# Patient Record
Sex: Female | Born: 1970 | Race: Black or African American | Hispanic: No | Marital: Single | Smoking: Never smoker
Health system: Southern US, Community
[De-identification: ages and names within clinical notes are randomized; demographics above are authoritative.]

## PROBLEM LIST (undated history)

## (undated) HISTORY — PX: TUBAL LIGATION: SHX77

---

## 1997-10-16 ENCOUNTER — Inpatient Hospital Stay (HOSPITAL_COMMUNITY): Admission: AD | Admit: 1997-10-16 | Discharge: 1997-10-16 | Payer: Self-pay | Admitting: Obstetrics

## 1997-10-20 ENCOUNTER — Ambulatory Visit (HOSPITAL_COMMUNITY): Admission: RE | Admit: 1997-10-20 | Discharge: 1997-10-20 | Payer: Self-pay | Admitting: *Deleted

## 1998-03-10 ENCOUNTER — Inpatient Hospital Stay (HOSPITAL_COMMUNITY): Admission: AD | Admit: 1998-03-10 | Discharge: 1998-03-12 | Payer: Self-pay | Admitting: Obstetrics & Gynecology

## 1998-11-07 ENCOUNTER — Inpatient Hospital Stay (HOSPITAL_COMMUNITY): Admission: AD | Admit: 1998-11-07 | Discharge: 1998-11-07 | Payer: Self-pay | Admitting: *Deleted

## 1999-02-03 ENCOUNTER — Emergency Department (HOSPITAL_COMMUNITY): Admission: EM | Admit: 1999-02-03 | Discharge: 1999-02-03 | Payer: Self-pay | Admitting: Emergency Medicine

## 1999-02-15 ENCOUNTER — Emergency Department (HOSPITAL_COMMUNITY): Admission: EM | Admit: 1999-02-15 | Discharge: 1999-02-15 | Payer: Self-pay | Admitting: Emergency Medicine

## 1999-04-12 ENCOUNTER — Emergency Department (HOSPITAL_COMMUNITY): Admission: EM | Admit: 1999-04-12 | Discharge: 1999-04-12 | Payer: Self-pay | Admitting: Emergency Medicine

## 1999-11-08 ENCOUNTER — Encounter: Payer: Self-pay | Admitting: Emergency Medicine

## 1999-11-08 ENCOUNTER — Emergency Department (HOSPITAL_COMMUNITY): Admission: EM | Admit: 1999-11-08 | Discharge: 1999-11-08 | Payer: Self-pay | Admitting: Emergency Medicine

## 2000-01-31 ENCOUNTER — Inpatient Hospital Stay (HOSPITAL_COMMUNITY): Admission: AD | Admit: 2000-01-31 | Discharge: 2000-01-31 | Payer: Self-pay | Admitting: *Deleted

## 2000-03-01 ENCOUNTER — Inpatient Hospital Stay (HOSPITAL_COMMUNITY): Admission: AD | Admit: 2000-03-01 | Discharge: 2000-03-01 | Payer: Self-pay | Admitting: *Deleted

## 2000-05-20 ENCOUNTER — Encounter: Payer: Self-pay | Admitting: *Deleted

## 2000-05-20 ENCOUNTER — Inpatient Hospital Stay (HOSPITAL_COMMUNITY): Admission: AD | Admit: 2000-05-20 | Discharge: 2000-05-20 | Payer: Self-pay | Admitting: *Deleted

## 2000-05-26 ENCOUNTER — Inpatient Hospital Stay (HOSPITAL_COMMUNITY): Admission: AD | Admit: 2000-05-26 | Discharge: 2000-05-26 | Payer: Self-pay | Admitting: Obstetrics

## 2000-06-03 ENCOUNTER — Inpatient Hospital Stay (HOSPITAL_COMMUNITY): Admission: AD | Admit: 2000-06-03 | Discharge: 2000-06-03 | Payer: Self-pay | Admitting: *Deleted

## 2000-06-04 ENCOUNTER — Inpatient Hospital Stay (HOSPITAL_COMMUNITY): Admission: AD | Admit: 2000-06-04 | Discharge: 2000-06-06 | Payer: Self-pay | Admitting: Obstetrics

## 2001-06-09 ENCOUNTER — Emergency Department (HOSPITAL_COMMUNITY): Admission: EM | Admit: 2001-06-09 | Discharge: 2001-06-09 | Payer: Self-pay

## 2001-10-18 ENCOUNTER — Encounter (INDEPENDENT_AMBULATORY_CARE_PROVIDER_SITE_OTHER): Payer: Self-pay | Admitting: *Deleted

## 2001-10-18 ENCOUNTER — Inpatient Hospital Stay (HOSPITAL_COMMUNITY): Admission: AD | Admit: 2001-10-18 | Discharge: 2001-10-21 | Payer: Self-pay | Admitting: Obstetrics and Gynecology

## 2001-10-18 ENCOUNTER — Encounter: Payer: Self-pay | Admitting: Obstetrics and Gynecology

## 2001-10-25 ENCOUNTER — Inpatient Hospital Stay (HOSPITAL_COMMUNITY): Admission: AD | Admit: 2001-10-25 | Discharge: 2001-10-25 | Payer: Self-pay | Admitting: *Deleted

## 2002-12-27 ENCOUNTER — Inpatient Hospital Stay (HOSPITAL_COMMUNITY): Admission: AD | Admit: 2002-12-27 | Discharge: 2002-12-27 | Payer: Self-pay | Admitting: Obstetrics

## 2003-02-10 ENCOUNTER — Encounter: Payer: Self-pay | Admitting: Obstetrics

## 2003-02-10 ENCOUNTER — Inpatient Hospital Stay (HOSPITAL_COMMUNITY): Admission: AD | Admit: 2003-02-10 | Discharge: 2003-02-10 | Payer: Self-pay | Admitting: Obstetrics

## 2003-02-20 ENCOUNTER — Encounter (INDEPENDENT_AMBULATORY_CARE_PROVIDER_SITE_OTHER): Payer: Self-pay | Admitting: Specialist

## 2003-02-21 ENCOUNTER — Inpatient Hospital Stay (HOSPITAL_COMMUNITY): Admission: AD | Admit: 2003-02-21 | Discharge: 2003-02-23 | Payer: Self-pay | Admitting: Obstetrics

## 2005-07-29 ENCOUNTER — Emergency Department (HOSPITAL_COMMUNITY): Admission: EM | Admit: 2005-07-29 | Discharge: 2005-07-29 | Payer: Self-pay | Admitting: Family Medicine

## 2006-11-19 ENCOUNTER — Emergency Department (HOSPITAL_COMMUNITY): Admission: EM | Admit: 2006-11-19 | Discharge: 2006-11-19 | Payer: Self-pay | Admitting: Emergency Medicine

## 2007-08-15 ENCOUNTER — Emergency Department (HOSPITAL_COMMUNITY): Admission: EM | Admit: 2007-08-15 | Discharge: 2007-08-15 | Payer: Self-pay | Admitting: Emergency Medicine

## 2010-11-23 NOTE — Discharge Summary (Signed)
[facility]  Patient:    Destiny [name], Destiny [name] Visit Number: [id number] MRN: [id number]          Service Type: OBS Location: 910A 9121 01 Attending Physician:  Amada Kingfisher. Dictated by:   Arlis Porta, M.D. Admit Date:  10/18/2001 Discharge Date: 10/21/2001   CC:         Shearon Balo, M.D.  [facility]   Discharge Summary  DISCHARGE DIAGNOSES:          Gravida 9, now para 9, status post low transverse cesarean section secondary to double footling breach presentation.  PROCEDURES:                   The patient underwent low transverse C section secondary to double footling breach productive for a viable female.  The operation was performed by Dr. Shearon Balo.  Apgars scores were 7/9 at 1 and 5 minutes.  Gestational age at delivery was approximately 36 weeks.  There were no postoperative complication.  DIAGNOSTIC DATA:              Ultrasound on admission showed single living fetus in footling breach position.  Estimated gestational age was 35 weeks and 1 day with [facility] of Nov 21, 2001.  CBC at discharge showed white blood cell count of 6.3, hemoglobin 9.9, hematocrit 29.9, platelet count 141.  Urine drug screen on admission was negative.  Obstetric panel on admission showed hepatitis B surface antigen negative, group B, Rh type negative, antibody screen negative, RPR nonreactive, rubella immune.  HOSPITAL COURSE:              This is a 40 year old African-American female, G9, now P9, status post low transverse C section secondary to a double footling breach presentation at 36+ weeks gestation.  This was productive for a viable female.  There were no intraoperative or postoperative complications. The patient was treated with ampicillin intrapartum.  During her postoperative course, the patient remained afebrile.  She has been tolerating p.o. well without vomiting.  The patient has had some flatus without bowel movement.  The patients incision  is clean, dry, and intact without discharge.  The patients vital signs remained stable.  She is bottle feeding.  She desire Depo-Provera prior to discharge.  She is in the process of applying for Medicaid and WIC and was seen by our Child psychotherapist.  She also desires tubal sterilization in the future.  However, her paperwork will need to wait until after her Medicaid situation is figured out.  She was discharged home on postop day #3.  DISCHARGE MEDICATIONS:        1. Ibuprofen over-the-counter as needed for                                  pain.                               2. Percocet 325/5 mg 1 to 2 tablets p.o. q.6h.                                  p.r.n. for pain.  3. Depo-Provera injection every 3 months.  WOUND CARE:                   Per instruction booklet.  DIET:                         Regular.  ACTIVITY:                     Per booklet.  For sexual activity, per booklet.  FOLLOWUP:                     She is to see [facility] in six weeks. Dictated by:   Arlis Porta, M.D. Attending Physician:  Amada Kingfisher. DD:  10/21/01 TD:  10/21/01 Job: [id number]

## 2010-11-23 NOTE — Op Note (Signed)
[facility]  Patient:    Destiny [name], Destiny [name] Visit Number: [id number] MRN: [id number]          Service Type: OBS Location: 910A 9121 01 Attending Physician:  Amada Kingfisher. Dictated by:   Shearon Balo, M.D. Proc. Date: 10/18/01 Admit Date:  10/18/2001                             Operative Report  PREOPERATIVE DIAGNOSIS:       A 40 year old gravida 9, para 8, at                               approximately 36 weeks with no prenatal care                               with spontaneous rupture of membranes and double                               footling breech.  POSTOPERATIVE DIAGNOSIS:      A 41 year old gravida 9, para 8, at                               approximately 36 weeks with no prenatal care                               with spontaneous rupture of membranes and double                               footling breech.  OPERATION:                    Primary low transverse C-section.  SURGEON:                      Shearon Balo, M.D.  ASSISTANT:                    Katy Fitch, M.D.  ANESTHESIA:                   Spinal.  COMPLICATIONS:                None.  SPECIMENS:                    Placenta and cord blood.  FINDINGS:                     Viable female infant delivered at 1435 with Apgars of 7 and 9.  DISPOSITION:                  Recovery room stable.  Sponge, instrument, and needle counts were correct at the end of the procedure.  DESCRIPTION OF PROCEDURE:     The patient was taken to the operating room where she was given spinal anesthesia.  She was prepped and draped in a sterile fashion.  A Pfannenstiel incision was performed with the scalpel and carried down to the underlying fascia.  The fascia was then nicked and dissected laterally with the Mayo scissors.  The fascia was  separated from the underlying rectus muscle bellies with sharp and blunt dissection.  The rectus muscle bellies were separated and the underlying peritoneum  was entered sharply.  The peritoneum was stretched and the bladder blade was placed.  A bladder flap was created with sharp and blunt dissection.  The uterus was scored with a scalpel blade and the uterus was entered with sharp dissection. The fluid was clear.  The uterine incision was extended laterally with the surgeons fingers.  The infants feet were grasped and delivered through the uterine incision.  The infants left shoulder was delivered without difficulty and the infant was rotated 180 degrees to the right and the right shoulder was delivered.  The infants head was flexed and delivered.  The cord was clamped and cut, and the infant was handed off to the waiting neonatal resuscitation team.  The placenta was then extracted and the uterus was externalized.  The uterus was curetted with a dry lap sponge.  The uterine incision was repaired with a running locking suture of 0 chromic.  A second imbricating layer of sutures was placed to obtain final hemostasis.  The uterus was returned to the abdominal cavity and the pericolic gutters were emptied of clot and debris. The uterine incision was again inspected and noted to be hemostatic.  The fascia was then closed with a running suture of 0 Vicryl.  The subcutaneous tissues were irrigated with copious amounts of saline and the skin edges were reapproximated with staples. Dictated by:   Shearon Balo, M.D. Attending Physician:  Amada Kingfisher. DD:  10/18/01 TD:  10/19/01 Job: [id number]

## 2010-11-23 NOTE — Op Note (Signed)
   NAME:  Destiny [name], Destiny [name]                         ACCOUNT NO.:  [id number]   MEDICAL RECORD NO.:  [id number]                   PATIENT TYPE:  INP   LOCATION:  9144                                 FACILITY:  [facility]   PHYSICIAN:  Kathreen Cosier, M.D.           DATE OF BIRTH:  1971-03-19   DATE OF PROCEDURE:  02/22/2003  DATE OF DISCHARGE:                                 OPERATIVE REPORT   PREOPERATIVE DIAGNOSIS:  Multiparity.   POSTOPERATIVE DIAGNOSIS:  Multiparity.   PROCEDURE:  Postpartum tubal ligation.   After spinal was administered, the patient in the supine position, abdomen  was prepped and draped, the bladder was emptied with straight catheter.  A  midline subumbilical incision 1 inch long was made and carried down to the  fascia.  The fascia was cleaned, grasped with two Kochers, and the fascia  and peritoneum opened with the Mayo scissors.  The left tube was grasped in  the mid portion with a Babcock clamp and the tube traced to the fimbria.  0  plain suture was placed on the mesosalpinx, the lower portion of the tube  was then clamped and this was cut and tied.  Hemostasis was satisfactory.  The procedure was done in exact fashion on the right side.  The abdomen was  closed in layers, the peritoneum and fascia with continuous 0 Vicryl and the  skin was closed with subcuticular suture of 3-0 Monocryl.                                               Kathreen Cosier, M.D.    BAM/MEDQ  D:  02/22/2003  T:  02/22/2003  Job:  [id number]

## 2010-11-23 NOTE — Discharge Summary (Signed)
   NAMEJENILEE, [name]                         ACCOUNT NO.:  [id number]   MEDICAL RECORD NO.:  [id number]                   PATIENT TYPE:  INP   LOCATION:  9144                                 FACILITY:  [facility]   PHYSICIAN:  Kathreen Cosier, M.D.           DATE OF BIRTH:  28-Dec-1970   DATE OF ADMISSION:  02/21/2003  DATE OF DISCHARGE:  02/23/2003                                 DISCHARGE SUMMARY   HISTORY OF PRESENT ILLNESS:  The patient is a 40 year old gravida 10, para 26-  0-0-9, with EDC of February 17, 2003 with a negative GBS, who was brought in  for induction on February 21, 2003 at patient's request. Her cervix was 3-4  cm, 70%, vertex and minus 2. Her membranes were ruptured. The fluid was  clear and she labored on her own. On admission, her hemoglobin was 11.3.  Platelets 155,000. The patient had a normal vaginal delivery and desired  sterilization, which she underwent on February 22, 2003. Postoperative she did  well. Her hemoglobin was 11.4, platelets 144,000.   DISPOSITION:  She was discharged home on the second postpartum day,  ambulatory and on a regular diet. On Tylenol #3 for pain. To see me in 6  weeks.   DISCHARGE DIAGNOSES:  1. Status post normal vaginal delivery after induction at term.  2. Postpartum tubal ligation.                                               Kathreen Cosier, M.D.    BAM/MEDQ  D:  02/23/2003  T:  02/23/2003  Job:  [id number]

## 2011-03-29 LAB — URINALYSIS, ROUTINE W REFLEX MICROSCOPIC
Bilirubin Urine: NEGATIVE
Glucose, UA: NEGATIVE
Hgb urine dipstick: NEGATIVE
Ketones, ur: NEGATIVE
Nitrite: NEGATIVE
Protein, ur: NEGATIVE
Specific Gravity, Urine: 1.026
Urobilinogen, UA: 0.2
pH: 5.5

## 2011-03-29 LAB — URINE MICROSCOPIC-ADD ON

## 2011-03-29 LAB — CBC
HCT: 34.1 — ABNORMAL LOW
MCHC: 34.7
MCV: 89.2
Platelets: 249
RBC: 3.82 — ABNORMAL LOW

## 2011-03-29 LAB — DIFFERENTIAL
Basophils Relative: 1
Eosinophils Absolute: 0.2
Eosinophils Relative: 4
Monocytes Relative: 8
Neutrophils Relative %: 50

## 2011-03-29 LAB — GC/CHLAMYDIA PROBE AMP, GENITAL: Chlamydia, DNA Probe: NEGATIVE

## 2011-03-29 LAB — WET PREP, GENITAL: Trich, Wet Prep: NONE SEEN

## 2017-07-19 ENCOUNTER — Emergency Department (HOSPITAL_COMMUNITY)
Admission: EM | Admit: 2017-07-19 | Discharge: 2017-07-19 | Disposition: A | Discharge status: Home / Self Care | Payer: Medicaid Other | Attending: Emergency Medicine | Admitting: Emergency Medicine

## 2017-07-19 ENCOUNTER — Other Ambulatory Visit: Payer: Self-pay

## 2017-07-19 ENCOUNTER — Emergency Department (HOSPITAL_COMMUNITY): Payer: Medicaid Other

## 2017-07-19 DIAGNOSIS — R079 Chest pain, unspecified: Secondary | ICD-10-CM | POA: Diagnosis not present

## 2017-07-19 LAB — I-STAT BETA HCG BLOOD, ED (MC, WL, AP ONLY)

## 2017-07-19 LAB — I-STAT TROPONIN, ED
TROPONIN I, POC: 0 ng/mL (ref 0.00–0.08)
Troponin i, poc: 0 ng/mL (ref 0.00–0.08)

## 2017-07-19 LAB — BASIC METABOLIC PANEL
Anion gap: 9 (ref 5–15)
BUN: 10 mg/dL (ref 6–20)
CHLORIDE: 105 mmol/L (ref 101–111)
CO2: 25 mmol/L (ref 22–32)
CREATININE: 0.83 mg/dL (ref 0.44–1.00)
Calcium: 9.8 mg/dL (ref 8.9–10.3)
GFR calc Af Amer: >60 mL/min (ref >60)
Glucose, Bld: 82 mg/dL (ref 65–99)
Potassium: 3.7 mmol/L (ref 3.5–5.1)
SODIUM: 139 mmol/L (ref 135–145)

## 2017-07-19 LAB — CBC
HCT: 39.5 % (ref 36.0–46.0)
Hemoglobin: 13.5 g/dL (ref 12.0–15.0)
MCH: 31.3 pg (ref 26.0–34.0)
MCHC: 34.2 g/dL (ref 30.0–36.0)
MCV: 91.4 fL (ref 78.0–100.0)
PLATELETS: 286 10*3/uL (ref 150–400)
RBC: 4.32 MIL/uL (ref 3.87–5.11)
RDW: 13 % (ref 11.5–15.5)
WBC: 5.1 10*3/uL (ref 4.0–10.5)

## 2017-07-19 LAB — D-DIMER, QUANTITATIVE: D-Dimer, Quant: 0.63 ug/mL-FEU — ABNORMAL HIGH (ref 0.00–0.50)

## 2017-07-19 LAB — BRAIN NATRIURETIC PEPTIDE: B Natriuretic Peptide: 29.3 pg/mL (ref 0.0–100.0)

## 2017-07-19 MED ORDER — ASPIRIN 81 MG PO CHEW
324.0000 mg | CHEWABLE_TABLET | Freq: Once | ORAL | Status: AC
  Administered 2017-07-19: 324 mg via ORAL
  Filled 2017-07-19: qty 4

## 2017-07-19 MED ORDER — IOPAMIDOL (ISOVUE-370) INJECTION 76%
INTRAVENOUS | Status: AC
  Filled 2017-07-19: qty 100

## 2017-07-19 MED ORDER — IOPAMIDOL (ISOVUE-370) INJECTION 76%
INTRAVENOUS | Status: AC
  Administered 2017-07-19: 100 mL via INTRAVENOUS
  Filled 2017-07-19: qty 100

## 2017-07-19 MED ORDER — IOPAMIDOL (ISOVUE-370) INJECTION 76%
INTRAVENOUS | Status: AC
  Filled 2017-07-19: qty 50

## 2017-07-19 NOTE — ED Provider Notes (Signed)
[facility] Provider Note   CSN: [id number] Arrival date & time: 07/19/17  1518     History   Chief Complaint Chief Complaint  Patient presents with  . Chest Pain    HPI Destiny Hall is a 47 y.o. female.  HPI  47 yo female with no significant medical history presents with concern for chest pain.   Reports while at work today had a few short episodes of chest pain. Described it like a thumb tack or knife sticking into her chest lasted a few seconds twice then went away.  At that time felt short of breath and lightheaded. No nausea or vomiting. Pain was 10/10.  No radiation to back or arm. Reports she has felt more fatigued over the last week and has had some bilateral foot and leg swelling.  No cough. No family history of CAD. No smoking or other drugs. No past medical history although has not seen physician in a long time.  No past medical history on file.  There are no active problems to display for this patient.   No past medical history  OB History    No data available       Home Medications    Prior to Admission medications   Not on File    Family History No family history on file.  Social History Social History   Tobacco Use  . Smoking status: Not on file  Substance Use Topics  . Alcohol use: Not on file  . Drug use: Not on file     Allergies   Patient has no known allergies.   Review of Systems Review of Systems  Constitutional: Positive for fatigue. Negative for fever.  HENT: Negative for sore throat.   Eyes: Negative for visual disturbance.  Respiratory: Positive for shortness of breath. Negative for cough.   Cardiovascular: Positive for chest pain and leg swelling.  Gastrointestinal: Negative for abdominal pain, nausea and vomiting.  Genitourinary: Negative for difficulty urinating.  Musculoskeletal: Negative for back pain and neck pain.  Skin: Negative for rash.  Neurological: Positive for  light-headedness. Negative for syncope and headaches.     Physical Exam Updated Vital Signs BP 115/85   Pulse (!) 58   Temp 98.3 F (36.8 C)   Resp 12   Ht 5\' 4"  (1.626 m)   Wt 125.2 kg (276 lb)   SpO2 100%   BMI 47.38 kg/m   Physical Exam  Constitutional: She is oriented to person, place, and time. She appears well-developed and well-nourished. No distress.  HENT:  Head: Normocephalic and atraumatic.  Eyes: Conjunctivae and EOM are normal.  Neck: Normal range of motion.  Cardiovascular: Normal rate, regular rhythm, normal heart sounds and intact distal pulses. Exam reveals no gallop and no friction rub.  No murmur heard. Pulmonary/Chest: Effort normal and breath sounds normal. No respiratory distress. She has no wheezes. She has no rales.  Abdominal: Soft. She exhibits no distension. There is no tenderness. There is no guarding.  Musculoskeletal: She exhibits no edema or tenderness.  Neurological: She is alert and oriented to person, place, and time.  Skin: Skin is warm and dry. No rash noted. She is not diaphoretic. No erythema.  Nursing note and vitals reviewed.    ED Treatments / Results  Labs (all labs ordered are listed, but only abnormal results are displayed) Labs Reviewed  D-DIMER, QUANTITATIVE (NOT [facility]) - Abnormal; Notable for the following components:  Result Value   D-Dimer, Quant 0.63 (*)    All other components within normal limits  BASIC METABOLIC PANEL  CBC  BRAIN NATRIURETIC PEPTIDE  I-STAT TROPONIN, ED  I-STAT BETA HCG BLOOD, ED ([facility], [facility], [facility] ONLY)  I-STAT TROPONIN, ED    EKG  EKG Interpretation  Date/Time:  Saturday July 19 2017 15:37:52 EST Ventricular Rate:  58 PR Interval:    QRS Duration: 86 QT Interval:  436 QTC Calculation: 429 R Axis:   51 Text Interpretation:  Sinus or ectopic atrial rhythm, feel more likely sinus rhythm, downward pw in aVR Low voltage, precordial leads No previous ECGs available Confirmed by Alvira Monday ([id number]) on 07/19/2017 4:36:18 PM       Radiology Dg Chest 2 View  Result Date: 07/19/2017 CLINICAL DATA:  Chest pain EXAM: CHEST  2 VIEW COMPARISON:  None. FINDINGS: Heart is borderline in size. Lungs are clear. No effusions or edema. No acute bony abnormality. IMPRESSION: No active cardiopulmonary disease. Electronically Signed   By: Charlett Nose M.D.   On: 07/19/2017 16:02   Ct Angio Chest Pe W And/or Wo Contrast  Result Date: 07/19/2017 CLINICAL DATA:  Chest pain for 8 hours. EXAM: CT ANGIOGRAPHY CHEST WITH CONTRAST TECHNIQUE: Multidetector CT imaging of the chest was performed using the standard protocol during bolus administration of intravenous contrast. Multiplanar CT image reconstructions and MIPs were obtained to evaluate the vascular anatomy. CONTRAST:  ISOVUE-370 IOPAMIDOL (ISOVUE-370) INJECTION 76% COMPARISON:  None. FINDINGS: Cardiovascular: The heart is upper limits of normal in size for age. No pericardial effusion. The aorta is normal in caliber. No dissection. No atherosclerotic calcifications. No coronary artery calcifications. Benign epicardial cyst near the inferior pulmonary vein on the right side. The pulmonary arterial tree is fairly well opacified on the second sequence. No filling defects to suggest pulmonary embolism. Mediastinum/Nodes: No mediastinal or hilar mass or adenopathy. Scattered lymph nodes are noted. Lungs/Pleura: No acute pulmonary findings. No worrisome pulmonary lesions. The tracheobronchial tree is unremarkable. Upper Abdomen: No significant upper abdominal findings. Musculoskeletal: No breast masses, supraclavicular or axillary adenopathy. The bony structures are unremarkable. Review of the MIP images confirms the above findings. IMPRESSION: 1. No CT findings for pulmonary embolism. 2. Normal thoracic aorta. 3. No acute pulmonary findings. Electronically Signed   By: Rudie Meyer M.D.   On: 07/19/2017 20:45    Procedures Procedures (including  critical care time)  Medications Ordered in ED Medications  aspirin chewable tablet 324 mg (324 mg Oral Given 07/19/17 1714)  iopamidol (ISOVUE-370) 76 % injection (100 mLs Intravenous Contrast Given 07/19/17 1954)     Initial Impression / Assessment and Plan / ED Course  I have reviewed the triage vital signs and the nursing notes.  Pertinent labs & imaging results that were available during my care of the patient were reviewed by me and considered in my medical decision making (see chart for details).      47 yo female with no significant medical history presents with concern for chest pain.  EKG shows no significant findings.  Troponin negative x2. DDimer positive and CTA PE study shows no PE or other abnormalities.  She is low risk HEART score. Chest pain free in ED.  Recommend close PCP follow up. Discussed reasons to return.  Patient discharged in stable condition with understanding of reasons to return.   Final Clinical Impressions(s) / ED Diagnoses   Final diagnoses:  Chest pain, unspecified type    ED Discharge Orders  None       Alvira Monday, MD 07/20/17 1340

## 2017-07-19 NOTE — ED Triage Notes (Signed)
Patient was at work and felt two chest pain episodes lasting a few seconds each of chest pain on left side that went away quickly. No meds given. Patient has been feeling "extra tired" and feet and leg swelling x 1 week.

## 2017-07-19 NOTE — ED Notes (Signed)
Patient transported to X-ray 

## 2017-07-21 ENCOUNTER — Encounter: Payer: Self-pay | Admitting: *Deleted

## 2017-07-21 NOTE — Progress Notes (Signed)
EDCM noted CM consult.  Reviewed chart and basis for consult are unclear as the CM consult order is incomplete and the EDP discharge note does not mention CM instructions.    

## 2018-05-06 ENCOUNTER — Encounter: Payer: Self-pay | Admitting: Family Medicine

## 2018-05-06 ENCOUNTER — Ambulatory Visit: Payer: Medicaid Other | Admitting: Family Medicine

## 2018-05-06 NOTE — Progress Notes (Signed)
Patient did not keep appointment today. She may call to reschedule.  

## 2018-05-26 ENCOUNTER — Ambulatory Visit: Payer: Medicaid Other | Admitting: Obstetrics and Gynecology

## 2018-06-24 ENCOUNTER — Ambulatory Visit: Payer: Medicaid Other | Admitting: Physical Therapy

## 2018-07-02 ENCOUNTER — Ambulatory Visit: Payer: Medicaid Other | Admitting: Physical Therapy

## 2018-07-15 ENCOUNTER — Ambulatory Visit: Payer: Medicaid Other | Attending: Urology | Admitting: Physical Therapy

## 2018-07-20 NOTE — Progress Notes (Deleted)
   Office Visit Note  Patient: Destiny Hall             Date of Birth: [birth year]           MRN: [id number]             PCP: Patient, No Pcp Per Referring: Jackie Plum, MD Visit Date: 08/03/2018 Occupation: @GUAROCC @  Subjective:  No chief complaint on file.   History of Present Illness: Destiny Hall is a 48 y.o. female ***   Activities of Daily Living:  Patient reports morning stiffness for *** {minute/hour:19697}.   Patient {ACTIONS;DENIES/REPORTS:21021675::"Denies"} nocturnal pain.  Difficulty dressing/grooming: {ACTIONS;DENIES/REPORTS:21021675::"Denies"} Difficulty climbing stairs: {ACTIONS;DENIES/REPORTS:21021675::"Denies"} Difficulty getting out of chair: {ACTIONS;DENIES/REPORTS:21021675::"Denies"} Difficulty using hands for taps, buttons, cutlery, and/or writing: {ACTIONS;DENIES/REPORTS:21021675::"Denies"}  No Rheumatology ROS completed.   PMFS History:  There are no active problems to display for this patient.   No past medical history on file.  No family history on file. *** The histories are not reviewed yet. Please review them in the "History" navigator section and refresh this SmartLink. Social History   Social History Narrative  . Not on file    There is no immunization history on file for this patient.   Objective: Vital Signs: There were no vitals taken for this visit.   Physical Exam   Musculoskeletal Exam: ***  CDAI Exam: CDAI Score: Not documented Patient Global Assessment: Not documented; Provider Global Assessment: Not documented Swollen: Not documented; Tender: Not documented Joint Exam   Not documented   There is currently no information documented on the homunculus. Go to the Rheumatology activity and complete the homunculus joint exam.  Investigation: Findings:  04/15/18:Uric acid 5.2, ANA+, RF<10, sed rate 23, dsDNA <1, TSH 3.130   Imaging: No results found.  Recent Labs: Lab Results  Component Value Date   WBC  5.1 07/19/2017   HGB 13.5 07/19/2017   PLT 286 07/19/2017   NA 139 07/19/2017   K 3.7 07/19/2017   CL 105 07/19/2017   CO2 25 07/19/2017   GLUCOSE 82 07/19/2017   BUN 10 07/19/2017   CREATININE 0.83 07/19/2017   CALCIUM 9.8 07/19/2017   GFRAA >60 07/19/2017    Speciality Comments: No specialty comments available.  Procedures:  No procedures performed Allergies: Patient has no known allergies.   Assessment / Plan:     Visit Diagnoses: Polyarthralgia - Uric acid 5.2, ANA+, RF<10, sed rate 23  Positive ANA (antinuclear antibody)   Orders: No orders of the defined types were placed in this encounter.  No orders of the defined types were placed in this encounter.   Face-to-face time spent with patient was *** minutes. Greater than 50% of time was spent in counseling and coordination of care.  Follow-Up Instructions: No follow-ups on file.   Gearldine Bienenstock, PA-C  Note - This record has been created using Dragon software.  Chart creation errors have been sought, but may not always  have been located. Such creation errors do not reflect on  the standard of medical care.

## 2018-07-23 ENCOUNTER — Ambulatory Visit: Payer: Medicaid Other | Admitting: Physical Therapy

## 2018-08-03 ENCOUNTER — Ambulatory Visit: Payer: Self-pay | Admitting: Rheumatology

## 2018-09-01 NOTE — Progress Notes (Deleted)
   Office Visit Note  Patient: Destiny Hall             Date of Birth: January 07, 1971           MRN: [id number]             PCP: Patient, No Pcp Per Referring: Jackie Plum, MD Visit Date: 09/14/2018 Occupation: @GUAROCC @  Subjective:  No chief complaint on file.   History of Present Illness: Destiny Hall is a 48 y.o. female ***   Activities of Daily Living:  Patient reports morning stiffness for *** {minute/hour:19697}.   Patient {ACTIONS;DENIES/REPORTS:21021675::"Denies"} nocturnal pain.  Difficulty dressing/grooming: {ACTIONS;DENIES/REPORTS:21021675::"Denies"} Difficulty climbing stairs: {ACTIONS;DENIES/REPORTS:21021675::"Denies"} Difficulty getting out of chair: {ACTIONS;DENIES/REPORTS:21021675::"Denies"} Difficulty using hands for taps, buttons, cutlery, and/or writing: {ACTIONS;DENIES/REPORTS:21021675::"Denies"}  No Rheumatology ROS completed.   PMFS History:  There are no active problems to display for this patient.   No past medical history on file.  No family history on file. *** The histories are not reviewed yet. Please review them in the "History" navigator section and refresh this SmartLink. Social History   Social History Narrative  . Not on file    There is no immunization history on file for this patient.   Objective: Vital Signs: There were no vitals taken for this visit.   Physical Exam   Musculoskeletal Exam: ***  CDAI Exam: CDAI Score: Not documented Patient Global Assessment: Not documented; Provider Global Assessment: Not documented Swollen: Not documented; Tender: Not documented Joint Exam   Not documented   There is currently no information documented on the homunculus. Go to the Rheumatology activity and complete the homunculus joint exam.  Investigation: Findings:  04/15/18:uric acid 5.2, ANA +, dsDNA <1, TSH 3.130,RF<10, Sed rate 23, CMP WNL, CBC WNL, UA wnl   Imaging: No results found.  Recent Labs: Lab Results    Component Value Date   WBC 5.1 07/19/2017   HGB 13.5 07/19/2017   PLT 286 07/19/2017   NA 139 07/19/2017   K 3.7 07/19/2017   CL 105 07/19/2017   CO2 25 07/19/2017   GLUCOSE 82 07/19/2017   BUN 10 07/19/2017   CREATININE 0.83 07/19/2017   CALCIUM 9.8 07/19/2017   GFRAA >60 07/19/2017    Speciality Comments: No specialty comments available.  Procedures:  No procedures performed Allergies: Patient has no known allergies.   Assessment / Plan:     Visit Diagnoses: Polyarthralgia - 04/15/18:uric acid 5.2, ANA +, dsDNA <1, TSH 3.130,RF<10, Sed rate 23, CMP WNL, CBC WNL, UA wnl  Positive ANA (antinuclear antibody)   Orders: No orders of the defined types were placed in this encounter.  No orders of the defined types were placed in this encounter.   Face-to-face time spent with patient was *** minutes. Greater than 50% of time was spent in counseling and coordination of care.  Follow-Up Instructions: No follow-ups on file.   Gearldine Bienenstock, PA-C  Note - This record has been created using Dragon software.  Chart creation errors have been sought, but may not always  have been located. Such creation errors do not reflect on  the standard of medical care.

## 2018-09-14 ENCOUNTER — Ambulatory Visit: Payer: Self-pay | Admitting: Rheumatology

## 2018-10-26 ENCOUNTER — Ambulatory Visit: Payer: Self-pay | Admitting: Rheumatology

## 2019-08-07 IMAGING — CT CT ANGIO CHEST
3 of 14 series · 13 of 38 positions shown · IV contrast (Omni 300)
Comparison: None.

CLINICAL DATA: Chest pain for 8 hours.

EXAM:
CT ANGIOGRAPHY CHEST WITH CONTRAST
TECHNIQUE: Multidetector CT imaging of the chest was performed using the
standard protocol during bolus administration of intravenous
contrast. Multiplanar CT image reconstructions and MIPs were
obtained to evaluate the vascular anatomy.
CONTRAST:  100mL H0A7T3-AME IOPAMIDOL (H0A7T3-AME) INJECTION 76%

[Series 9: pe 2mm · axial · 0.72mm/px · z∈[-193,-113]mm · 2 of 122 slices shown]
[im 41/122  lung]
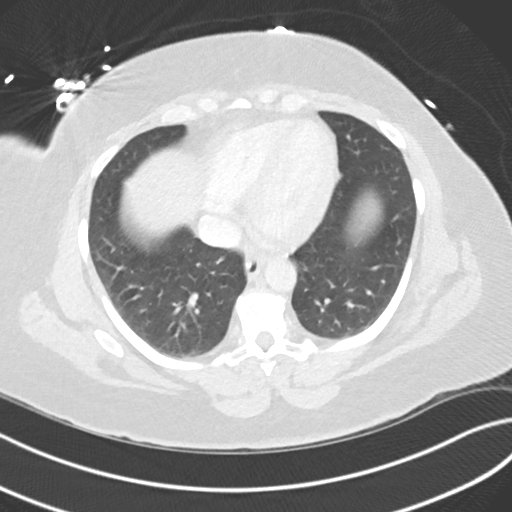
[im 81/122  lung]
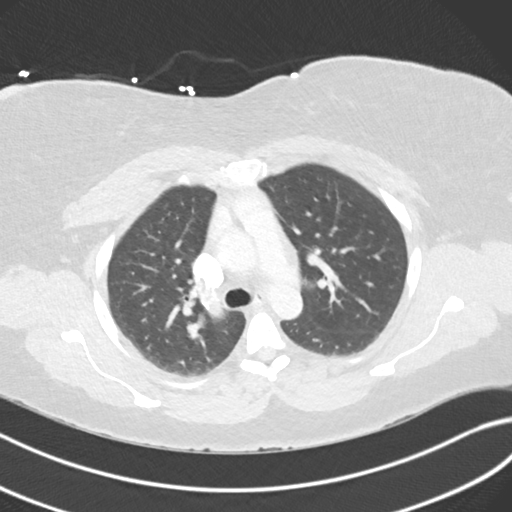

[Series 11: pe thins · axial · 0.72mm/px · z∈[-240,-66]mm · 6 of 244 slices shown (1 of 2)]
[im 35/244  lung]
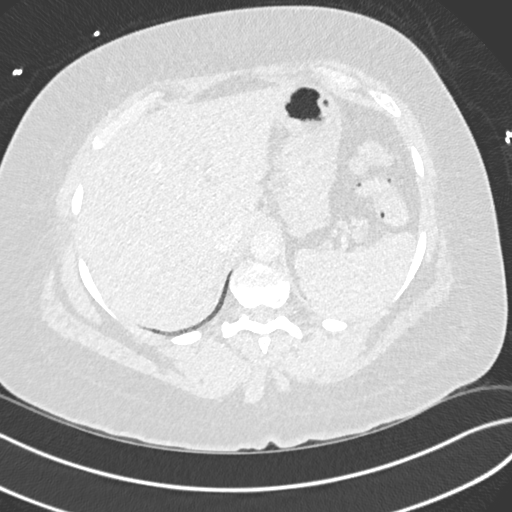
[im 70/244  mediastinal]
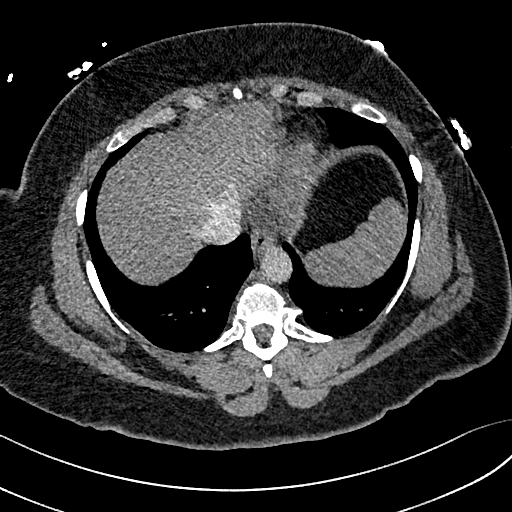
[im 105/244  lung]
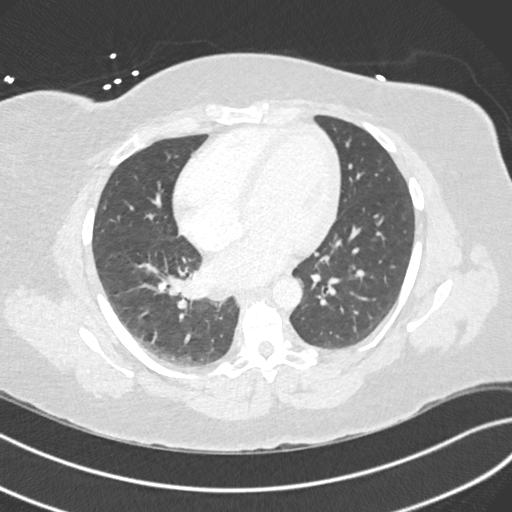
[im 139/244  mediastinal]
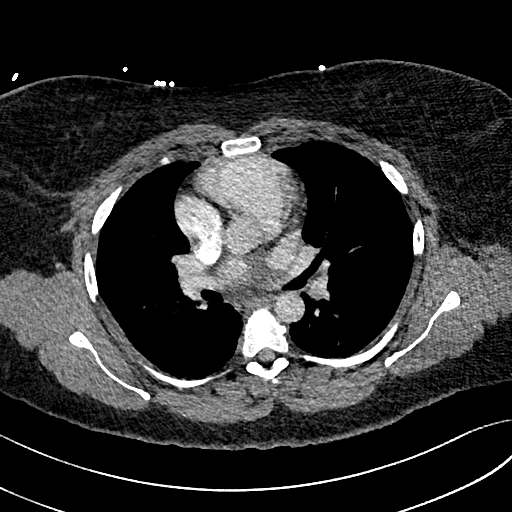
[im 174/244  lung]
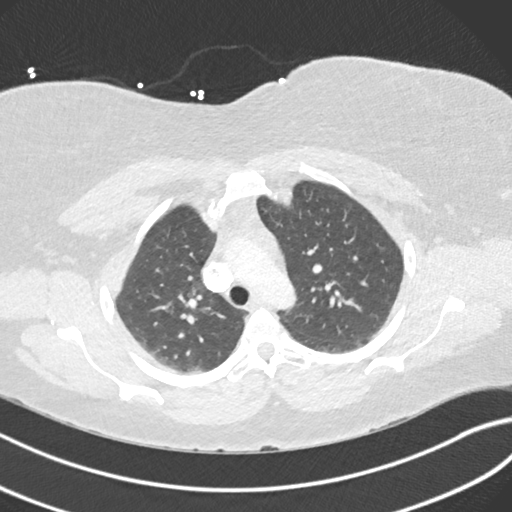
[im 209/244  mediastinal]
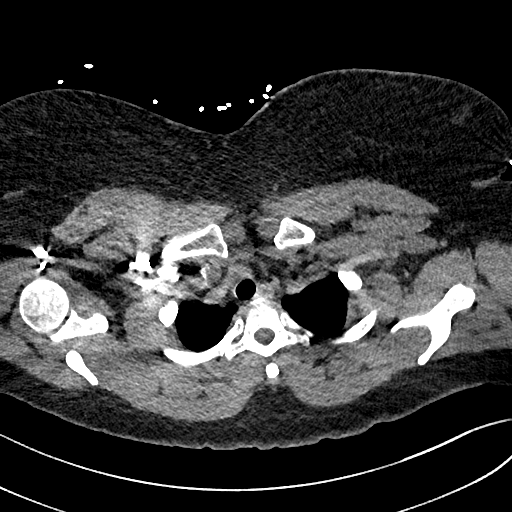

[Series 20: pe thins · axial · 0.72mm/px · z∈[-218,-70]mm · 5 of 224 slices shown (2 of 2)]
[im 38/224  lung]
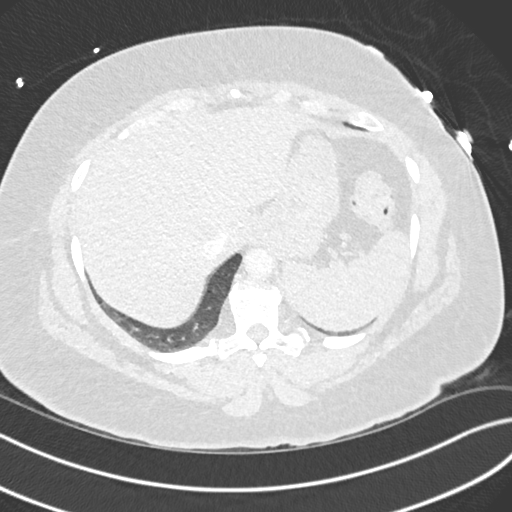
[im 75/224  lung]
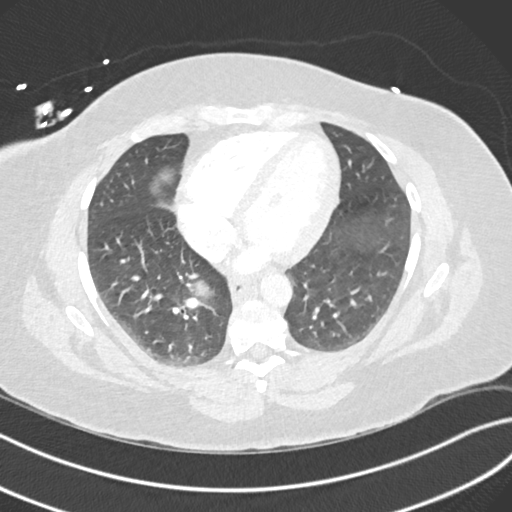
[im 112/224  lung]
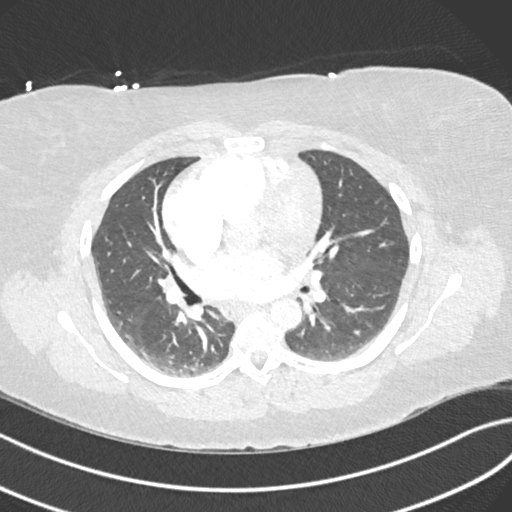
[im 149/224  lung]
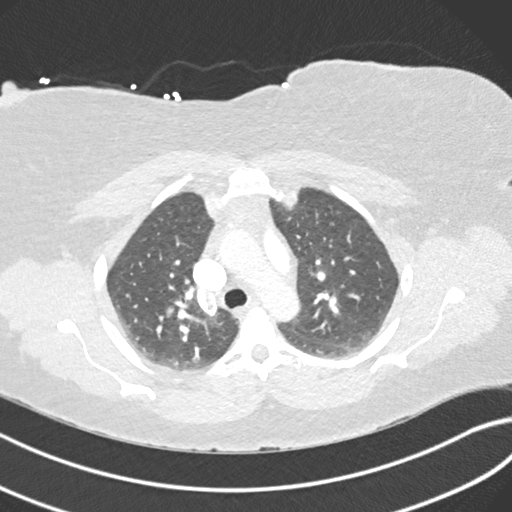
[im 186/224  lung]
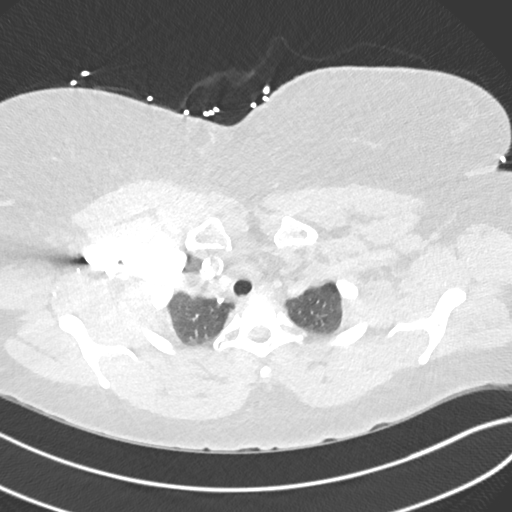

[13 of 38 positions shown; findings below may reference images not displayed]

FINDINGS: Cardiovascular: The heart is upper limits of normal in size for age.
No pericardial effusion. The aorta is normal in caliber. No
dissection. No atherosclerotic calcifications. No coronary artery
calcifications. Benign epicardial cyst near the inferior pulmonary
vein on the right side.

The pulmonary arterial tree is fairly well opacified on the second
sequence. No filling defects to suggest pulmonary embolism.

Mediastinum/Nodes: No mediastinal or hilar mass or adenopathy.
Scattered lymph nodes are noted.

Lungs/Pleura: No acute pulmonary findings. No worrisome pulmonary
lesions. The tracheobronchial tree is unremarkable.

Upper Abdomen: No significant upper abdominal findings.

Musculoskeletal: No breast masses, supraclavicular or axillary
adenopathy. The bony structures are unremarkable.

Review of the MIP images confirms the above findings.
IMPRESSION: 1. No CT findings for pulmonary embolism.
2. Normal thoracic aorta.
3. No acute pulmonary findings.

## 2019-08-07 IMAGING — DX DG CHEST 2V
2 series · 2 of 2 positions shown · non-contrast
Comparison: None.

CLINICAL DATA: Chest pain

EXAM:
CHEST  2 VIEW

[chest lat]
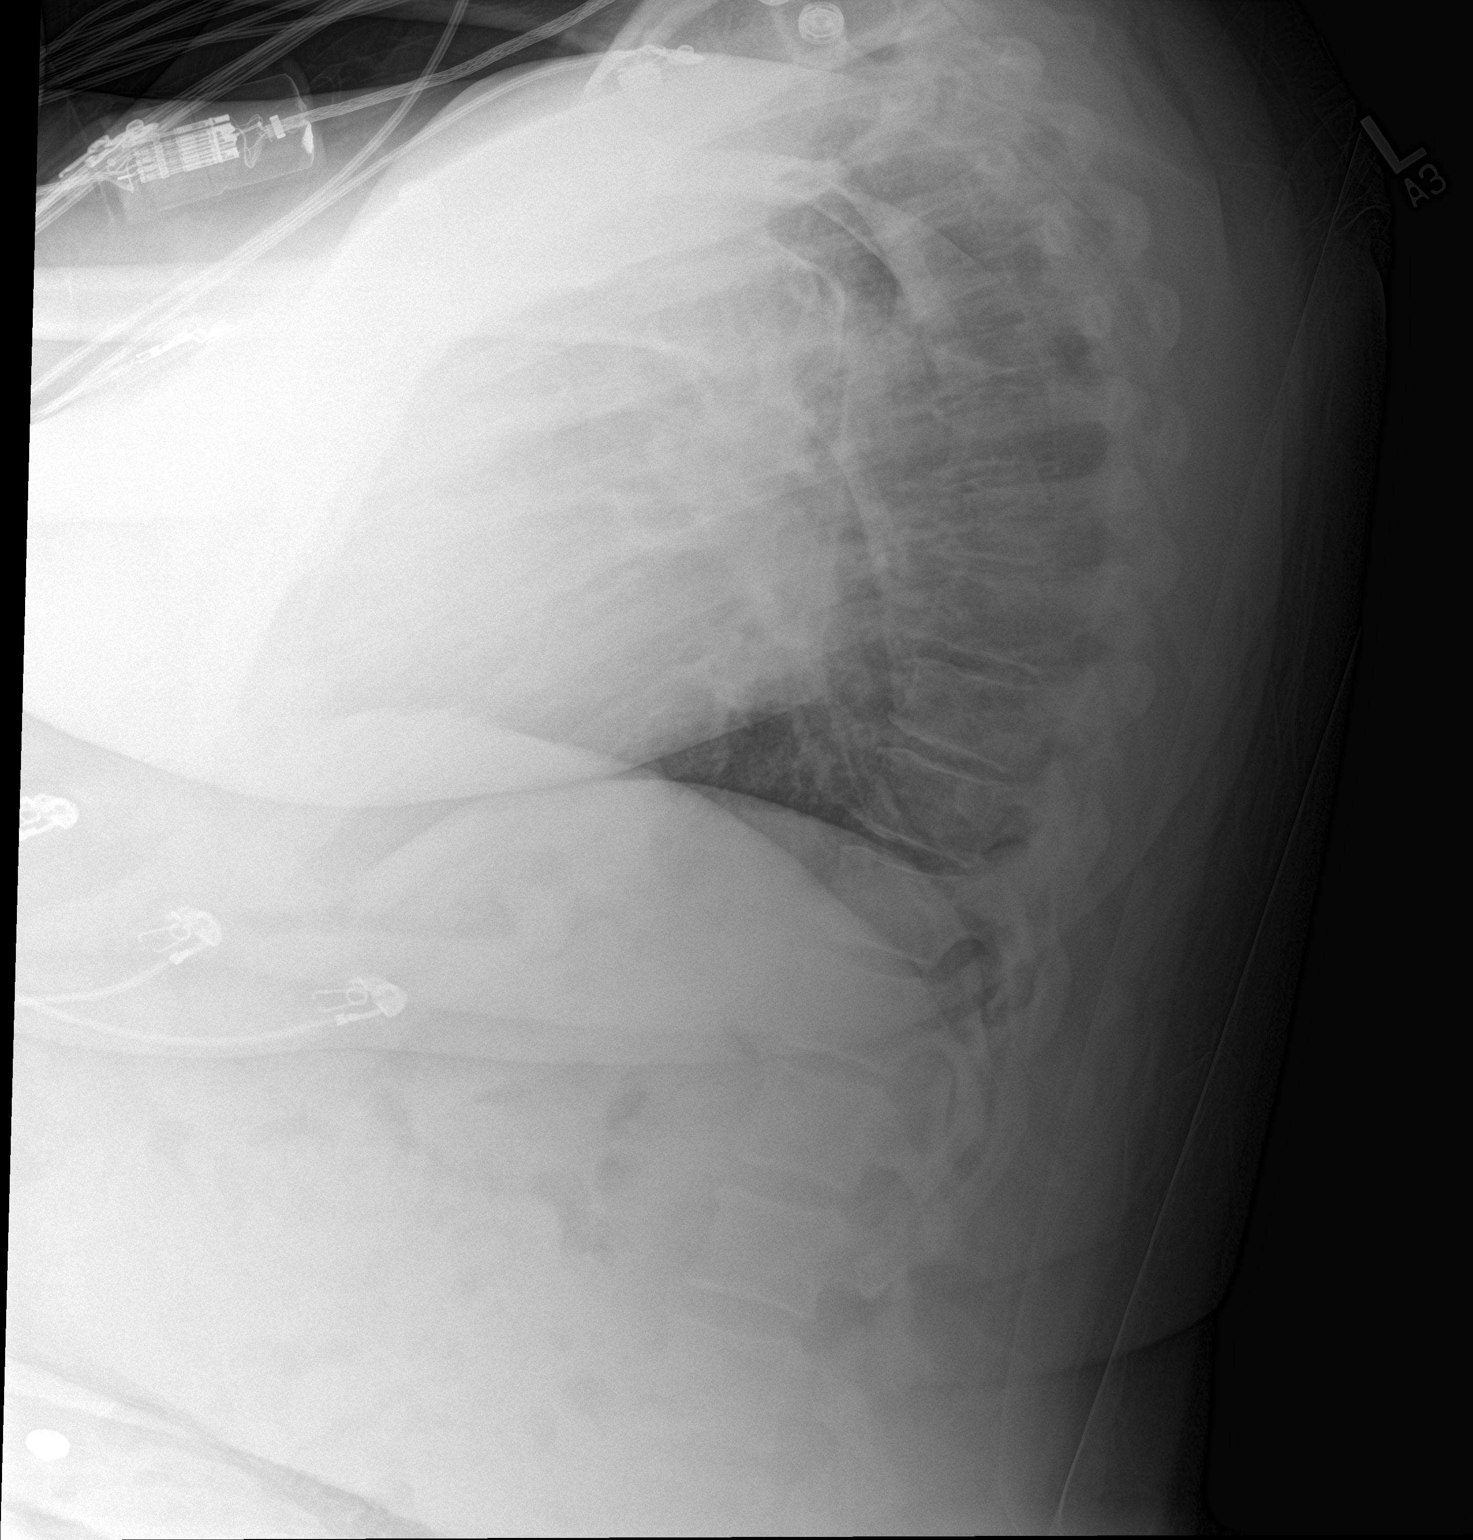

[chest ap]
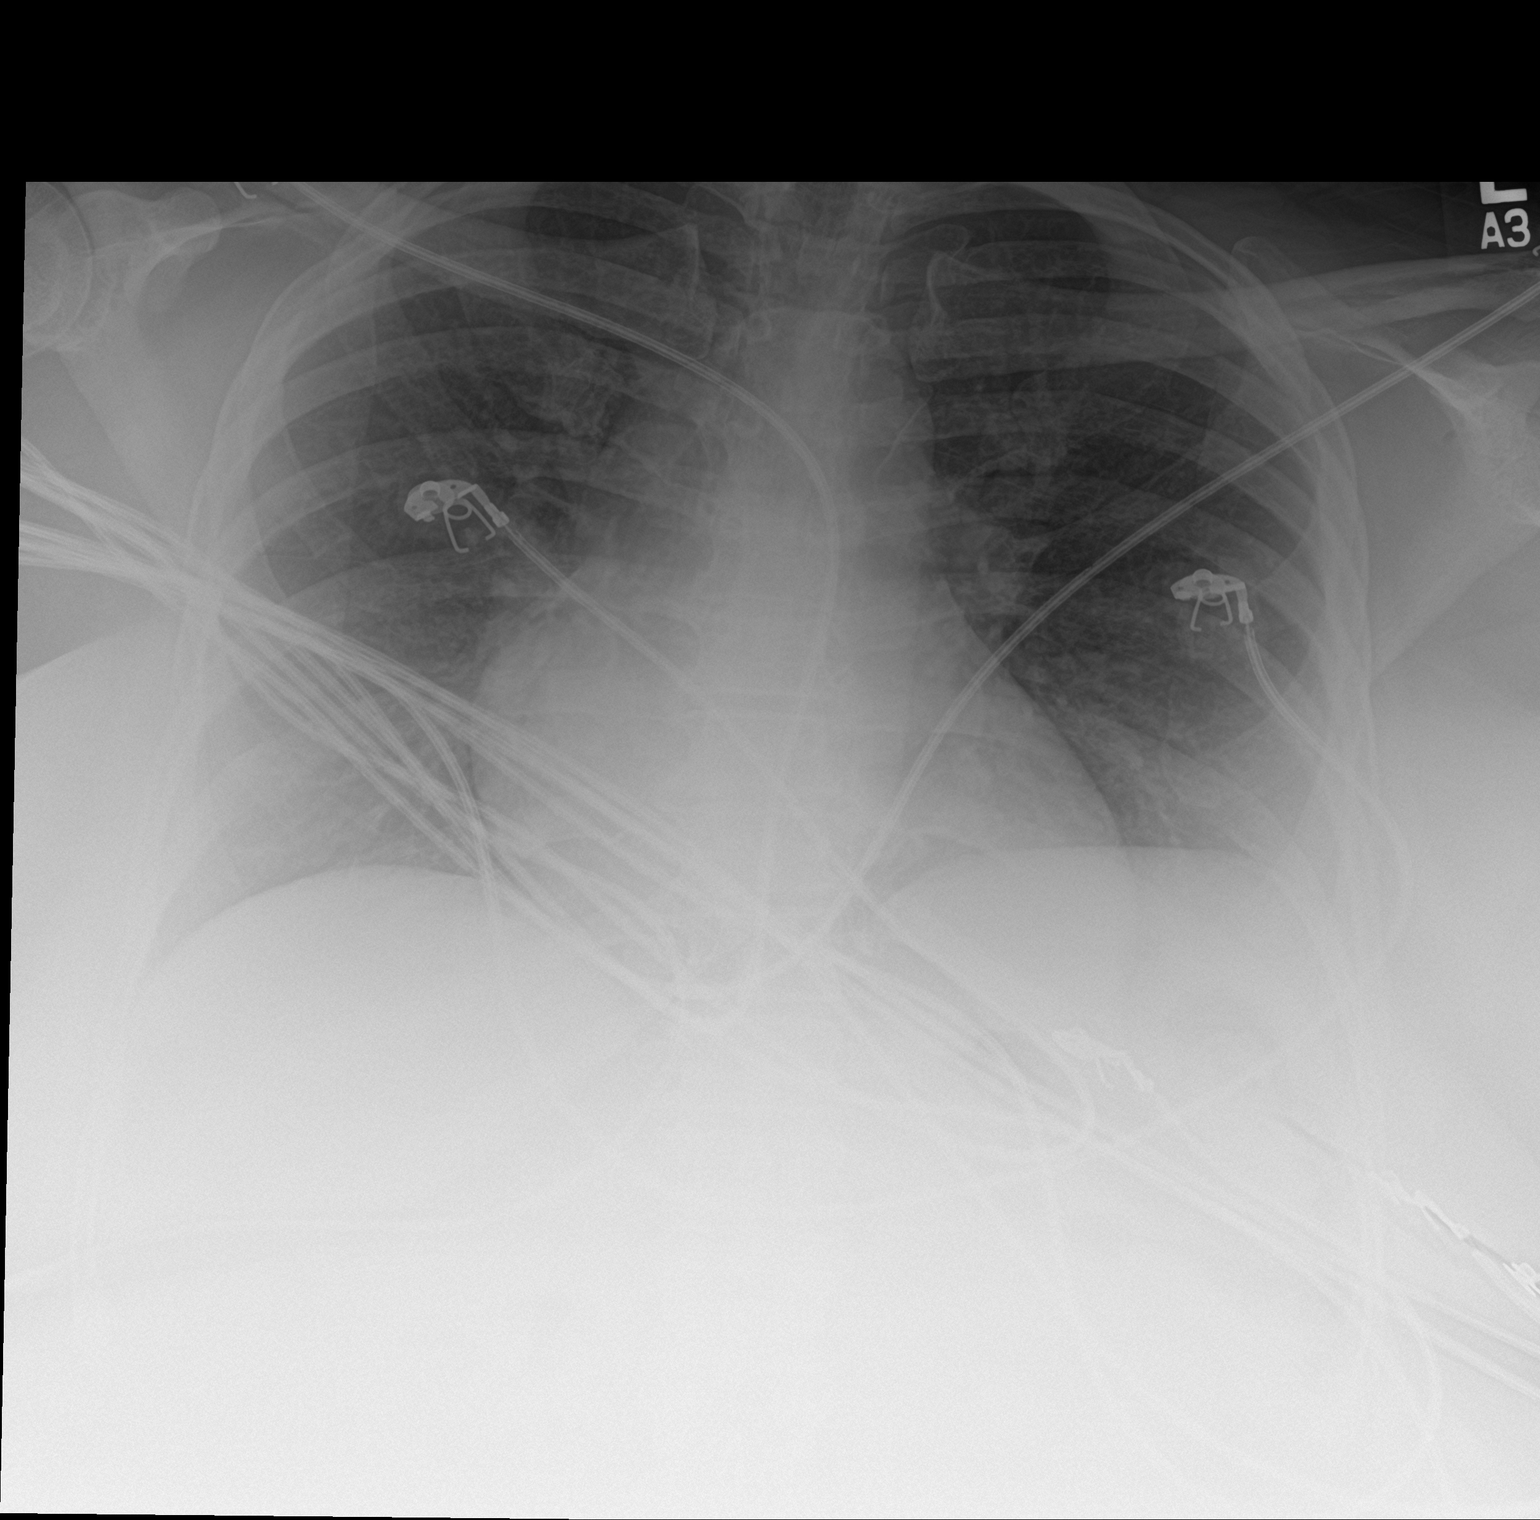

[2 of 2 positions shown; findings below may reference images not displayed]

FINDINGS: Heart is borderline in size. Lungs are clear. No effusions or edema.
No acute bony abnormality.
IMPRESSION: No active cardiopulmonary disease.

## 2022-05-10 ENCOUNTER — Other Ambulatory Visit: Payer: Self-pay | Admitting: Physician Assistant

## 2022-05-10 DIAGNOSIS — Z1231 Encounter for screening mammogram for malignant neoplasm of breast: Secondary | ICD-10-CM

## 2023-03-11 ENCOUNTER — Emergency Department (HOSPITAL_COMMUNITY): Payer: Medicaid Other

## 2023-03-11 ENCOUNTER — Other Ambulatory Visit: Payer: Self-pay

## 2023-03-11 ENCOUNTER — Encounter (HOSPITAL_COMMUNITY): Payer: Self-pay

## 2023-03-11 ENCOUNTER — Emergency Department (HOSPITAL_COMMUNITY)
Admission: EM | Admit: 2023-03-11 | Discharge: 2023-03-11 | Disposition: A | Discharge status: Home / Self Care | Payer: Medicaid Other | Attending: Emergency Medicine | Admitting: Emergency Medicine

## 2023-03-11 DIAGNOSIS — X58XXXA Exposure to other specified factors, initial encounter: Secondary | ICD-10-CM | POA: Diagnosis not present

## 2023-03-11 DIAGNOSIS — M545 Low back pain, unspecified: Secondary | ICD-10-CM | POA: Diagnosis present

## 2023-03-11 DIAGNOSIS — S39012A Strain of muscle, fascia and tendon of lower back, initial encounter: Secondary | ICD-10-CM | POA: Diagnosis not present

## 2023-03-11 LAB — HCG, QUANTITATIVE, PREGNANCY: hCG, Beta Chain, Quant, S: 1 m[IU]/mL (ref <5)

## 2023-03-11 MED ORDER — CYCLOBENZAPRINE HCL 10 MG PO TABS
10.0000 mg | ORAL_TABLET | Freq: Two times a day (BID) | ORAL | 0 refills | Status: AC | PRN
Start: 2023-03-11 — End: ?

## 2023-03-11 MED ORDER — IBUPROFEN 600 MG PO TABS: 600.0000 mg | ORAL_TABLET | Freq: Four times a day (QID) | ORAL | 0 refills | Status: DC | PRN

## 2023-03-11 MED ORDER — OXYCODONE HCL 5 MG PO TABS
5.0000 mg | ORAL_TABLET | Freq: Once | ORAL | Status: AC
  Administered 2023-03-11: 5 mg via ORAL
  Filled 2023-03-11: qty 1

## 2023-03-11 MED ORDER — KETOROLAC TROMETHAMINE 15 MG/ML IJ SOLN
15.0000 mg | Freq: Once | INTRAMUSCULAR | Status: DC
  Filled 2023-03-11: qty 1

## 2023-03-11 MED ORDER — CYCLOBENZAPRINE HCL 10 MG PO TABS
10.0000 mg | ORAL_TABLET | Freq: Once | ORAL | Status: AC
  Administered 2023-03-11: 10 mg via ORAL
  Filled 2023-03-11: qty 1

## 2023-03-11 MED ORDER — KETOROLAC TROMETHAMINE 15 MG/ML IJ SOLN
15.0000 mg | Freq: Once | INTRAMUSCULAR | Status: AC
  Administered 2023-03-11: 15 mg via INTRAMUSCULAR

## 2023-03-11 NOTE — ED Provider Notes (Signed)
[place] EMERGENCY DEPARTMENT AT [facility] Provider Note   CSN: [id number] Arrival date & time: 03/11/23  1506     History  Chief Complaint  Patient presents with   Back Pain    Destiny Hall is a 52 y.o. female.  The history is provided by the patient and medical records. No language interpreter was used.  Back Pain    Destiny Hall is a 52 year-old female with no significant medical history who presents to the ED with a complaint of back pain. She says earlier today she was sitting in a chair and experienced acute onset severe low back pain upon standing. The pain is constant and rated 10/10. She endorses some radiation into her upper back. Patient says pain is worse with sitting, standing, and movement. Pain improves when lying prone. She denies radiation of pain into the lower extremities. She denies any numbness, tingling, or loss of bowel or bladder function. She says this has never happened before and denies any history of injury or trauma to her back.   Home Medications Prior to Admission medications   Not on File      Allergies    Patient has no known allergies.    Review of Systems   Review of Systems  Musculoskeletal:  Positive for back pain.  All other systems reviewed and are negative.   Physical Exam Updated Vital Signs BP (!) 143/91   Pulse 76   Temp 98.4 F (36.9 C) (Oral)   Resp 18   Ht 5\' 4"  (1.626 m)   Wt 125.2 kg   SpO2 98%   BMI 47.38 kg/m  Physical Exam Vitals and nursing note reviewed.  Constitutional:      General: She is not in acute distress.    Appearance: She is well-developed.  HENT:     Head: Atraumatic.  Eyes:     Conjunctiva/sclera: Conjunctivae normal.  Pulmonary:     Effort: Pulmonary effort is normal.  Musculoskeletal:        General: Tenderness (Point tenderness noted to lumbar midline spine at the level of L2-L3 without any overlying skin changes no crepitus no step-off noted.  No paraspinal muscle  tenderness no overlying skin changes.) present.     Cervical back: Neck supple.  Skin:    Findings: No rash.  Neurological:     Mental Status: She is alert.  Psychiatric:        Mood and Affect: Mood normal.     ED Results / Procedures / Treatments   Labs (all labs ordered are listed, but only abnormal results are displayed) Labs Reviewed  HCG, QUANTITATIVE, PREGNANCY    EKG None  Radiology DG Thoracic Spine 2 View  Result Date: 03/11/2023 CLINICAL DATA:  Back pain, no known injury. EXAM: THORACIC SPINE 2 VIEWS COMPARISON:  None Available. FINDINGS: Degenerative spurring throughout the mid and lower thoracic spine. Normal alignment. No fracture or focal bone lesion. IMPRESSION: No acute bony abnormality. Electronically Signed   By: Charlett Nose M.D.   On: 03/11/2023 20:53   DG Lumbar Spine Complete  Result Date: 03/11/2023 CLINICAL DATA:  Back pain, no known injury. EXAM: LUMBAR SPINE - COMPLETE 4+ VIEW COMPARISON:  None available FINDINGS: Degenerative facet disease throughout the lumbar spine, most pronounced in the lower lumbar spine. Disc spaces are maintained. Normal alignment. No fracture. SI joints symmetric and unremarkable. 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 No acute bony abnormality. Electronically Signed  By: Charlett Nose M.D.   On: 03/11/2023 20:50    Procedures Procedures    Medications Ordered in ED Medications  oxyCODONE (Oxy IR/ROXICODONE) immediate release tablet 5 mg (5 mg Oral Given 03/11/23 1655)  ketorolac (TORADOL) 15 MG/ML injection 15 mg (15 mg Intramuscular Given 03/11/23 1656)    ED Course/ Medical Decision Making/ A&P                                 Medical Decision Making Amount and/or Complexity of Data Reviewed Labs: ordered. Radiology: ordered.  Risk Prescription drug management.   BP (!) 143/91   Pulse 76   Temp 98.4 F (36.9 C) (Oral)   Resp 18   Ht 5\' 4"  (1.626 m)   Wt 125.2 kg    SpO2 98%   BMI 47.38 kg/m   8:13 PM Destiny Hall is a 52 year-old female with no significant medical history who presents to the ED with a complaint of back pain. She says earlier today she was sitting in a chair and experienced acute onset severe low back pain upon standing. The pain is constant and rated 10/10. She endorses some radiation into her upper back. Patient says pain is worse with sitting, standing, and movement. Pain improves when lying prone. She denies radiation of pain into the lower extremities. She denies any numbness, tingling, or loss of bowel or bladder function. She says this has never happened before and denies any history of injury or trauma to her back.   On exam this is an obese female laying in a prone position talking on the phone and in no acute discomfort.  She does have point tenderness noted to her lumbar spine at the level of L2-L3 but no overlying skin changes.  She has increasing pain with flexion of her right hip.  She is neurovascular intact.  No concerning rash.  Vital signs notable for elevated blood pressure of 140 40/91.  She is afebrile no hypoxia.  -Labs ordered, independently viewed and interpreted by me.  Labs remarkable for negative pregnancy test -The patient was maintained on a cardiac monitor.  I personally viewed and interpreted the cardiac monitored which showed an underlying rhythm of: NSR -Imaging independently viewed and interpreted by me and I agree with radiologist's interpretation.  Result remarkable for xray of Lspine and Tspine showing degenerative changes but no acute fx -This patient presents to the ED for concern of back pain, this involves an extensive number of treatment options, and is a complaint that carries with it a high risk of complications and morbidity.  The differential diagnosis includes strain, sprain, fx, dislocation, cellulitis, kidney stone, shingles, dissection -Co morbidities that complicate the patient evaluation  includes obesity -Treatment includes flexeril, toradol, oxycodone -Reevaluation of the patient after these medicines showed that the patient improved -PCP office notes or outside notes reviewed -Escalation to admission/observation considered: patients feels much better, is comfortable with discharge, and will follow up with PCP -Prescription medication considered, patient comfortable with ibuprofen, flexeril -Social Determinant of Health considered         Final Clinical Impression(s) / ED Diagnoses Final diagnoses:  Strain of lumbar region, initial encounter    Rx / DC Orders ED Discharge Orders          Ordered    ibuprofen (ADVIL) 600 MG tablet  Every 6 hours PRN        03/11/23 2118  cyclobenzaprine (FLEXERIL) 10 MG tablet  2 times daily PRN        03/11/23 2118              Fayrene Helper, PA-C 03/11/23 2149    Lonell Grandchild, MD 03/12/23 1135

## 2023-03-11 NOTE — ED Triage Notes (Addendum)
Pt c/o mid back pain that radiates from mid lower back to mid thoracic area started today. Pt denies injury. Pt denies numbness and tinging. PT denies loss of bowel or bladder. Pt denies urinary issues

## 2023-03-11 NOTE — Discharge Instructions (Addendum)
You have been evaluated for your symptoms.  X-ray today did not show any concerning finding except degenerative change to your lumbar spine which may contribute to your back pain.  Please take medication prescribed and you may follow-up with orthopedic doctor as needed.  Return if you have any concern.

## 2023-12-25 ENCOUNTER — Other Ambulatory Visit: Payer: Self-pay | Admitting: Physician Assistant

## 2023-12-25 DIAGNOSIS — Z1231 Encounter for screening mammogram for malignant neoplasm of breast: Secondary | ICD-10-CM

## 2024-02-04 ENCOUNTER — Ambulatory Visit (AMBULATORY_SURGERY_CENTER)

## 2024-02-04 VITALS — Ht 64.0 in | Wt 264.0 lb

## 2024-02-04 DIAGNOSIS — Z1211 Encounter for screening for malignant neoplasm of colon: Secondary | ICD-10-CM

## 2024-02-04 MED ORDER — NA SULFATE-K SULFATE-MG SULF 17.5-3.13-1.6 GM/177ML PO SOLN
1.0000 | Freq: Once | ORAL | 0 refills | Status: AC
Start: 2024-02-04 — End: 2024-02-04

## 2024-02-04 NOTE — Progress Notes (Signed)
 No egg or soy allergy known to patient  No issues known to pt with past sedation with any surgeries or procedures Patient denies ever being told they had issues or difficulty with intubation  No FH of Malignant Hyperthermia diet pills: yes  Pt is not on  home 02  Pt is not on blood thinners  Pt denies issues with constipation  No A fib or A flutter Have any cardiac testing pending--no  LOA: independent  Prep: suprep  Patient's chart reviewed by Norleen Schillings CNRA prior to previsit and patient appropriate for the [facility].  Previsit completed and red dot placed by patient's name on their procedure day (on provider's schedule).     PV completed with patient. Prep instructions sent via mychart and home address.

## 2024-02-22 NOTE — Progress Notes (Deleted)
 [facility] History and Physical   Primary Care Physician:  Rosalea Rosina SAILOR, PA   Reason for Procedure:  Colorectal cancer screening  Plan:    Screening colonoscopy     HPI: Destiny Hall is a 53 y.o. female undergoing screening colonoscopy for colorectal cancer screening.  This is the patient's first colonoscopy.  No family history of colorectal cancer or polyps.  Patient denies current symptoms of rectal bleeding and change in bowel habits.   No past medical history on file.  Past Surgical History:  Procedure Laterality Date   CESAREAN SECTION     TUBAL LIGATION      Prior to Admission medications   Medication Sig Start Date End Date Taking? Authorizing Provider  amoxicillin (AMOXIL) 500 MG tablet Take 500 mg by mouth every 8 (eight) hours. 01/28/24   [provider]  cetirizine (ZYRTEC) 10 MG tablet Take 10 mg by mouth daily. 04/17/18   [provider]  cyclobenzaprine  (FLEXERIL ) 10 MG tablet Take 1 tablet (10 mg total) by mouth 2 (two) times daily as needed for muscle spasms. 03/11/23   Nivia Colon, PA-C  ibuprofen  (ADVIL ) 600 MG tablet Take 1 tablet (600 mg total) by mouth every 6 (six) hours as needed. 03/11/23   Nivia Colon, PA-C  meloxicam (MOBIC) 15 MG tablet Take 15 mg by mouth daily.    [provider]  montelukast (SINGULAIR) 10 MG tablet Take 10 mg by mouth daily.    [provider]  phentermine (ADIPEX-P) 37.5 MG tablet Take 37.5 mg by mouth daily. 01/14/24   [provider]  sulindac (CLINORIL) 200 MG tablet Take 200 mg by mouth 2 (two) times daily.    [provider]  tiZANidine (ZANAFLEX) 4 MG tablet Take 4 mg by mouth every 8 (eight) hours as needed. 12/25/23   [provider]    Current Outpatient Medications  Medication Sig Dispense Refill   amoxicillin (AMOXIL) 500 MG tablet Take 500 mg by mouth every 8 (eight) hours.     cetirizine (ZYRTEC) 10 MG tablet Take 10 mg by mouth daily.      cyclobenzaprine  (FLEXERIL ) 10 MG tablet Take 1 tablet (10 mg total) by mouth 2 (two) times daily as needed for muscle spasms. 20 tablet 0   ibuprofen  (ADVIL ) 600 MG tablet Take 1 tablet (600 mg total) by mouth every 6 (six) hours as needed. 30 tablet 0   meloxicam (MOBIC) 15 MG tablet Take 15 mg by mouth daily.     montelukast (SINGULAIR) 10 MG tablet Take 10 mg by mouth daily.     phentermine (ADIPEX-P) 37.5 MG tablet Take 37.5 mg by mouth daily.     sulindac (CLINORIL) 200 MG tablet Take 200 mg by mouth 2 (two) times daily.     tiZANidine (ZANAFLEX) 4 MG tablet Take 4 mg by mouth every 8 (eight) hours as needed.     No current facility-administered medications for this visit.    Allergies as of 02/23/2024   (No Known Allergies)    Family History  Problem Relation Age of Onset   Colon cancer Neg Hx    Rectal cancer Neg Hx    Stomach cancer Neg Hx     Social History   Socioeconomic History   Marital status: Single    Spouse name: Not on file   Number of children: Not on file   Years of education: Not on file   Highest education level: Not on file  Occupational History  Not on file  Tobacco Use   Smoking status: Never   Smokeless tobacco: Never  Substance and Sexual Activity   Alcohol use: Never   Drug use: Never   Sexual activity: Not on file  Other Topics Concern   Not on file  Social History Narrative   Not on file   Social Drivers of Health   Financial Resource Strain: Not on file  Food Insecurity: Not on file  Transportation Needs: Not on file  Physical Activity: Not on file  Stress: Not on file  Social Connections: Not on file  Intimate Partner Violence: Not on file    Review of Systems:  All other review of systems negative except as mentioned in the HPI.  Physical Exam: Vital signs There were no vitals taken for this visit.  General:   Alert,  Well-developed, well-nourished, pleasant and cooperative in NAD Airway:  Mallampati  Lungs:  Clear  throughout to auscultation.   Heart:  Regular rate and rhythm; no murmurs, clicks, rubs,  or gallops. Abdomen:  Soft, nontender and nondistended. Normal bowel sounds.   Neuro/Psych:  Normal mood and affect. A and O x 3  Inocente Hausen, MD [facility]

## 2024-02-23 ENCOUNTER — Encounter: Admitting: Pediatrics

## 2024-08-10 ENCOUNTER — Emergency Department (HOSPITAL_COMMUNITY)

## 2024-08-10 ENCOUNTER — Emergency Department (HOSPITAL_COMMUNITY)
Admission: EM | Admit: 2024-08-10 | Discharge: 2024-08-10 | Disposition: A | Discharge status: Home / Self Care | Attending: Emergency Medicine | Admitting: Emergency Medicine

## 2024-08-10 ENCOUNTER — Other Ambulatory Visit: Payer: Self-pay

## 2024-08-10 ENCOUNTER — Encounter (HOSPITAL_COMMUNITY): Payer: Self-pay

## 2024-08-10 DIAGNOSIS — S82831A Other fracture of upper and lower end of right fibula, initial encounter for closed fracture: Secondary | ICD-10-CM | POA: Insufficient documentation

## 2024-08-10 DIAGNOSIS — W009XXA Unspecified fall due to ice and snow, initial encounter: Secondary | ICD-10-CM | POA: Insufficient documentation

## 2024-08-10 MED ORDER — HYDROCODONE-ACETAMINOPHEN 5-325 MG PO TABS
1.0000 | ORAL_TABLET | Freq: Once | ORAL | Status: AC
  Administered 2024-08-10: 1 via ORAL
  Filled 2024-08-10: qty 1

## 2024-08-10 MED ORDER — IBUPROFEN 600 MG PO TABS: 600.0000 mg | ORAL_TABLET | Freq: Four times a day (QID) | ORAL | 0 refills | Status: AC | PRN

## 2024-08-10 NOTE — ED Triage Notes (Signed)
 BIBA from home post mechanical fall on ice.  PT c/o right ankle pain.  Edema noted.  No obvious deformity.  No medication taken prior to arrival

## 2024-08-10 NOTE — Discharge Instructions (Signed)
 Take the medications as needed for pain or discomfort.  Use the crutches and the boot for your ankle fracture.  Follow-up with an orthopedic doctor to be rechecked.  Call their office to schedule an appointment
